# Patient Record
Sex: Male | Born: 1945 | Race: White | Hispanic: No | Marital: Married | Smoking: Former smoker
Health system: Southern US, Community
[De-identification: ages and names within clinical notes are randomized; demographics above are authoritative.]

## PROBLEM LIST (undated history)

## (undated) DIAGNOSIS — K644 Residual hemorrhoidal skin tags: Secondary | ICD-10-CM

## (undated) HISTORY — DX: Residual hemorrhoidal skin tags: K64.4

## (undated) HISTORY — PX: NO PAST SURGERIES: SHX2092

---

## 2008-04-11 ENCOUNTER — Emergency Department: Payer: Self-pay | Admitting: Emergency Medicine

## 2008-04-11 ENCOUNTER — Ambulatory Visit: Payer: Self-pay | Admitting: Internal Medicine

## 2008-04-13 ENCOUNTER — Emergency Department: Payer: Self-pay | Admitting: Internal Medicine

## 2008-04-14 ENCOUNTER — Emergency Department: Payer: Self-pay | Admitting: Internal Medicine

## 2008-04-19 ENCOUNTER — Emergency Department: Payer: Self-pay | Admitting: Emergency Medicine

## 2008-04-21 ENCOUNTER — Emergency Department: Payer: Self-pay | Admitting: Emergency Medicine

## 2009-05-05 ENCOUNTER — Ambulatory Visit: Payer: Self-pay | Admitting: Family Medicine

## 2009-06-08 ENCOUNTER — Ambulatory Visit: Payer: Self-pay | Admitting: Surgery

## 2009-08-10 ENCOUNTER — Ambulatory Visit: Payer: Self-pay | Admitting: Surgery

## 2009-11-24 ENCOUNTER — Ambulatory Visit: Payer: Self-pay | Admitting: Gastroenterology

## 2010-12-27 ENCOUNTER — Ambulatory Visit: Payer: Self-pay | Admitting: Surgery

## 2011-01-04 ENCOUNTER — Ambulatory Visit: Payer: Self-pay | Admitting: Surgery

## 2011-01-23 IMAGING — US US CAROTID DUPLEX BILAT
1 series · 17 of 24 positions shown · non-contrast
Comparison: none

REASON FOR EXAM: Stenosis
COMMENTS:

[Series 1: us carotid duplex bilat · 17 of 53 slices shown]
[im 1/53]
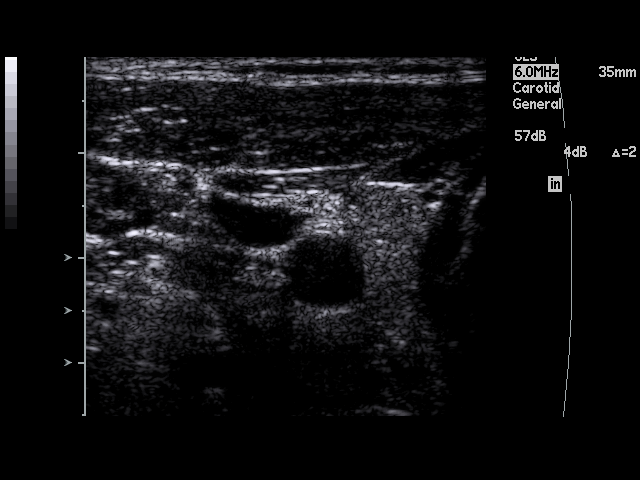
[im 5/53]
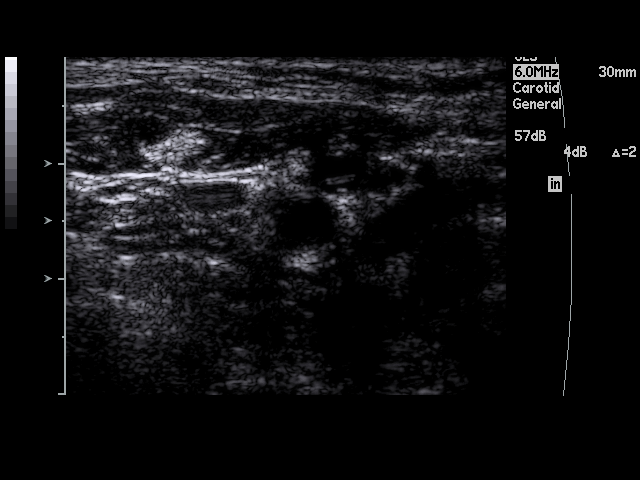
[im 7/53]
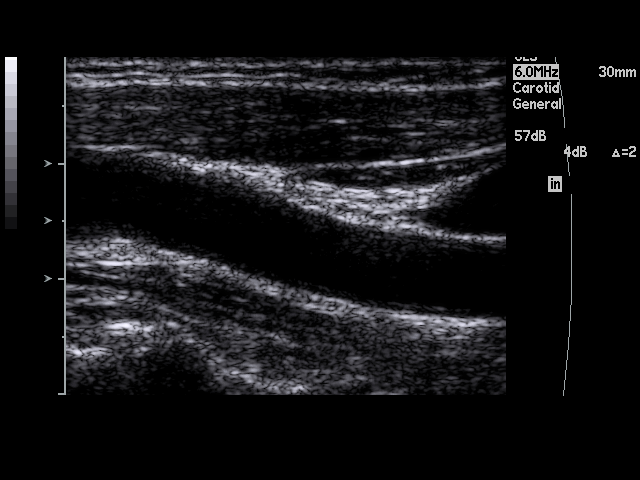
[im 10/53]
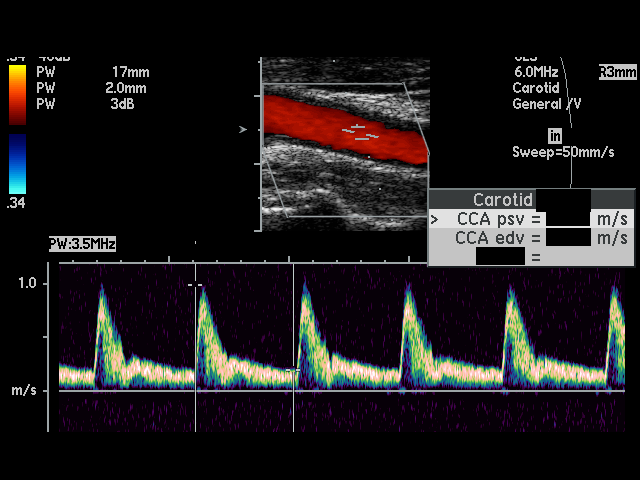
[im 14/53]
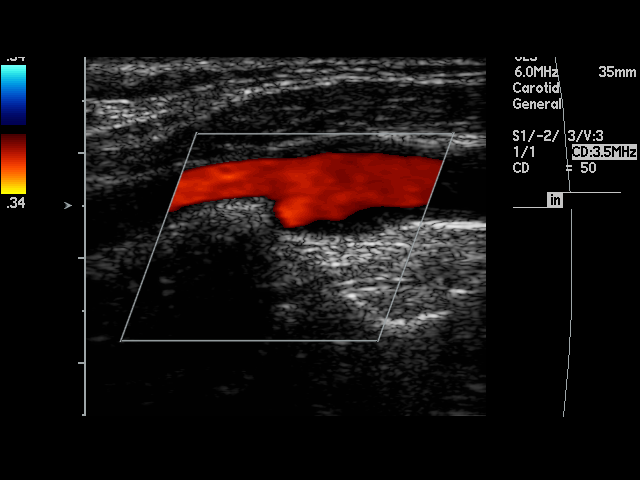
[im 16/53]
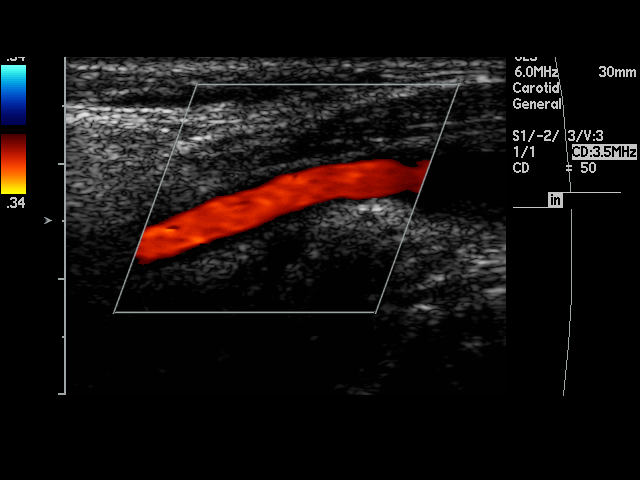
[im 21/53]
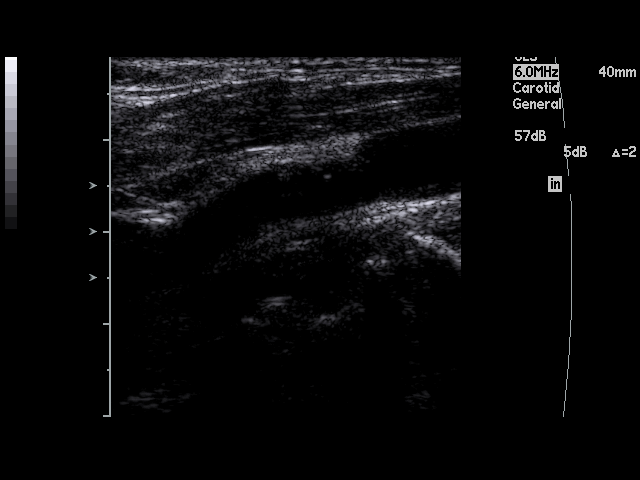
[im 23/53]
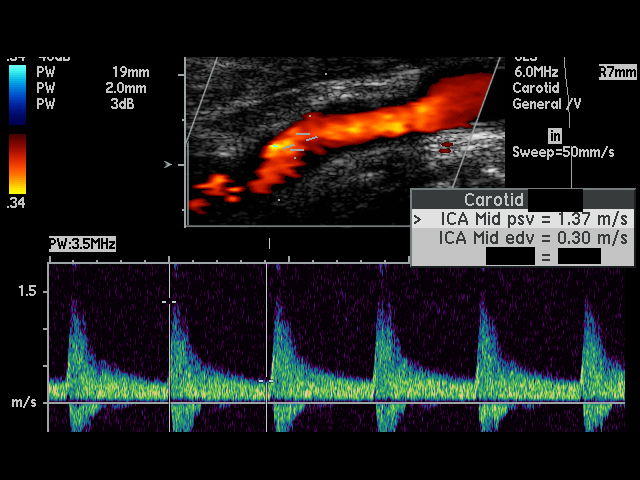
[im 28/53]
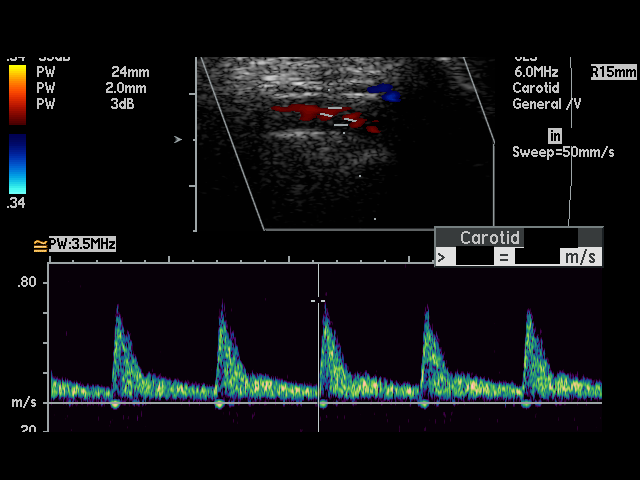
[im 30/53]
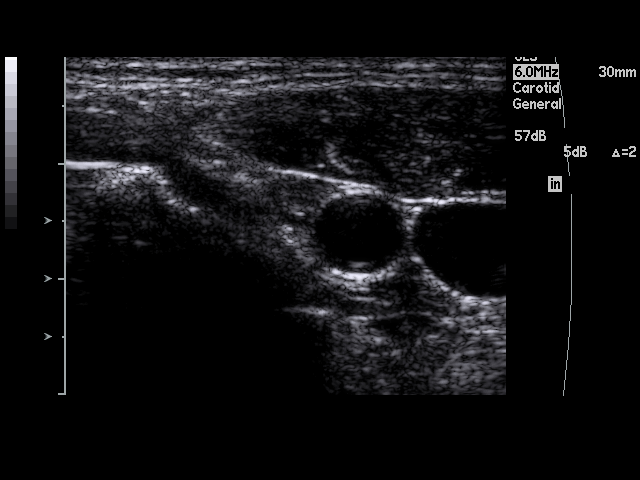
[im 32/53]
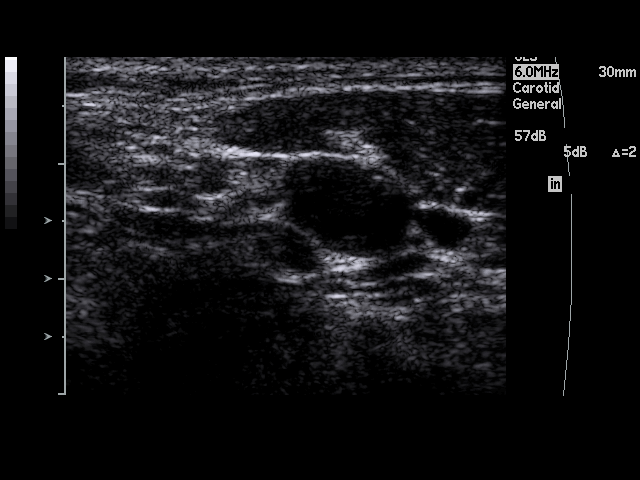
[im 37/53]
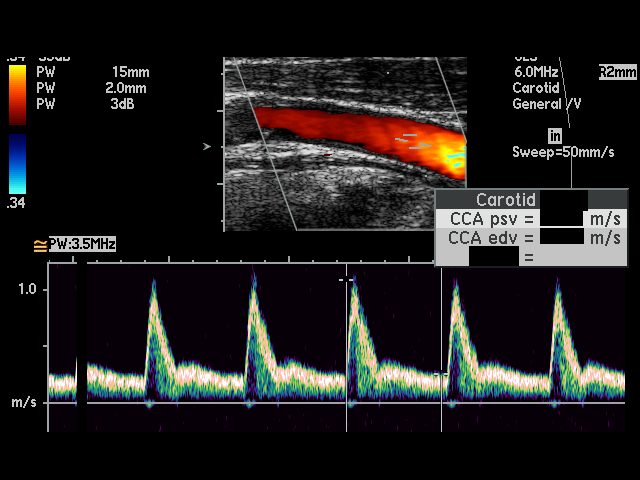
[im 39/53]
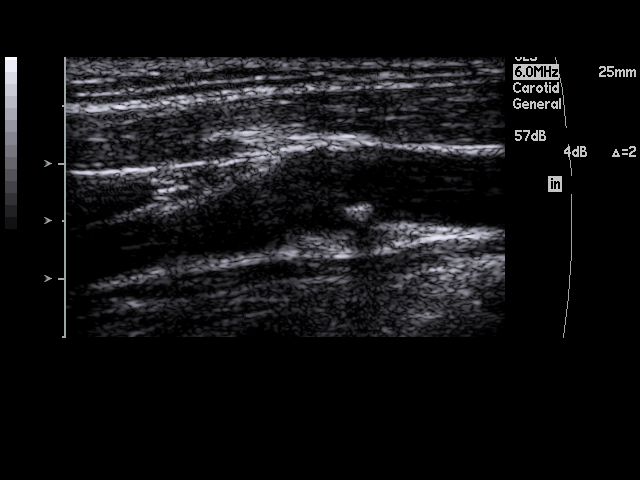
[im 43/53]
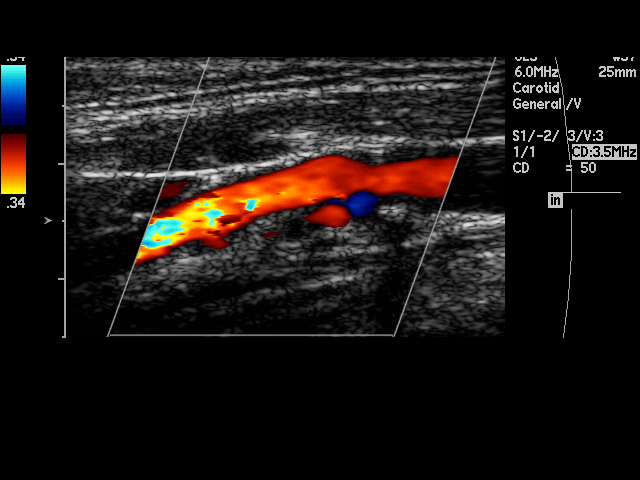
[im 46/53]
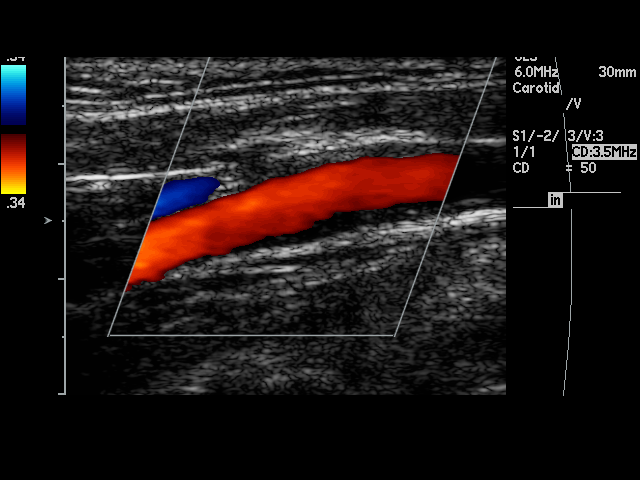
[im 48/53]
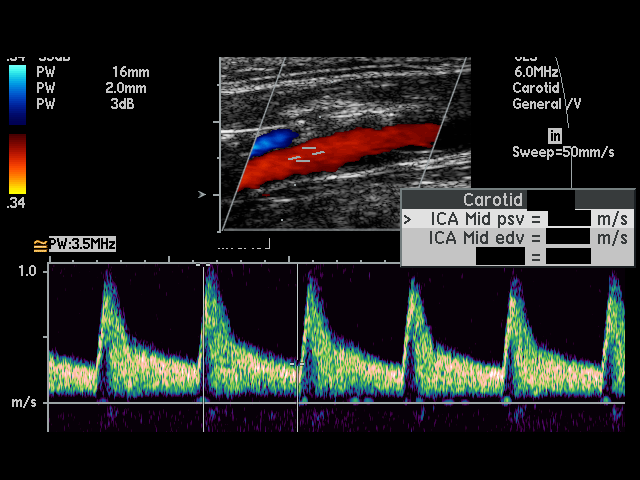
[im 53/53]
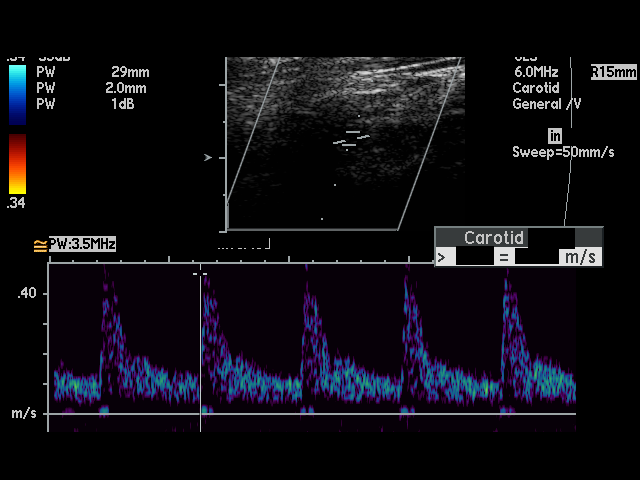

[17 of 24 positions shown; findings below may reference images not displayed]

PROCEDURE:     LATIA - LATIA CAROTID DOPPLER BILATERAL  - May 05, 2009  [DATE]

RESULT:     The patient is status post endarterectomy on the right. There is
a small amount of soft plaque or intimal thickening at the proximal right
internal carotid. On the left there is slight soft and calcific plaque
formation at the bifurcation.

On the right, the peak right common carotid artery flow velocity measures
1.053 meters per second and the peak right internal carotid artery flow
velocity measures 1.47 meters per second. The ICA:CCA ratio is 1.396.

On the left, the peak left common carotid artery flow velocity measures
1.096 meters per second and the peak left internal carotid artery flow
velocity measures 1.054 meters per second. The ICA:CCA ratio is 1.017.

These values bilaterally are consistent with the bilateral absence of
hemodynamically significant stenosis.

There is observed antegrade flow in both vertebrals.
IMPRESSION: 1.  There is a small amount of soft plaque or intimal thickening at the
proximal right internal carotid.
2.  There is slight soft and calcific plaque formation at the carotid
bifurcation on the left.
3.  No hemodynamically significant stenosis is identified on either side.
4.  There is antegrade flow in both vertebrals.

## 2011-04-30 IMAGING — US US PELVIS LIMITED
1 series · 17 of 25 positions shown · non-contrast
Comparison: none

REASON FOR EXAM: right inguinal hernia repair [DATE]   swelling right
testicle
COMMENTS:

[Series 1: us pelvis limited · 17 of 43 slices shown]
[im 1/43]
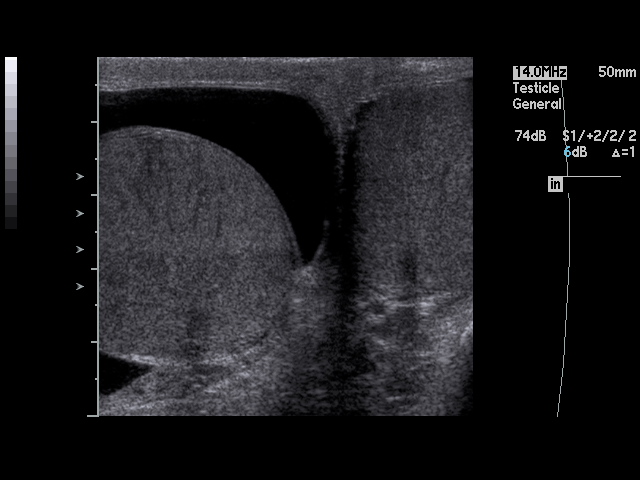
[im 4/43]
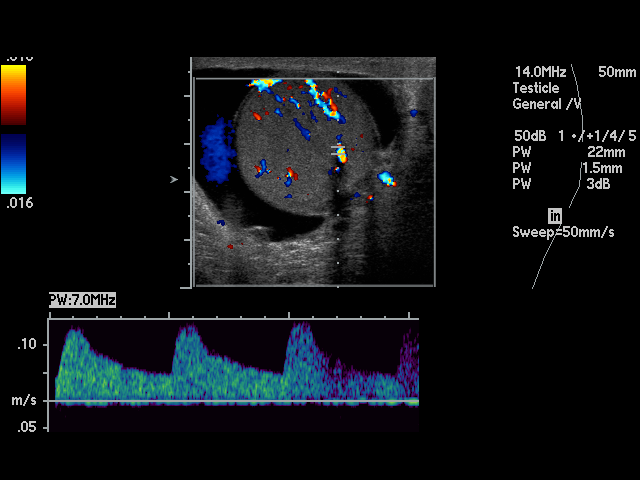
[im 6/43]
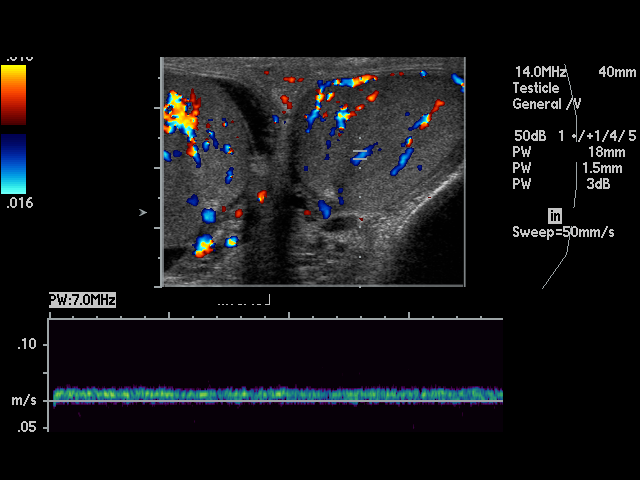
[im 9/43]
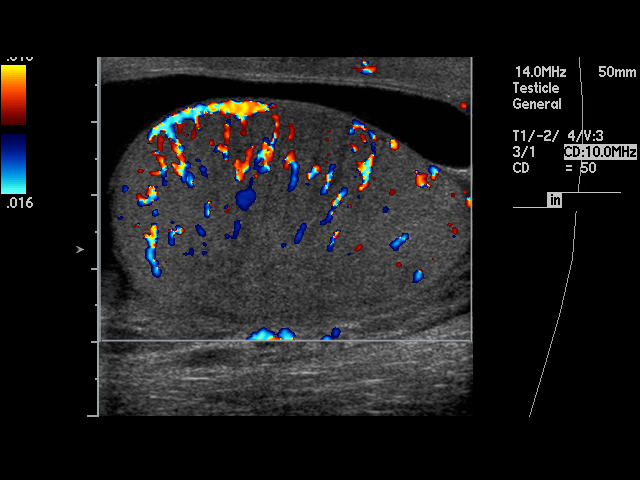
[im 11/43]
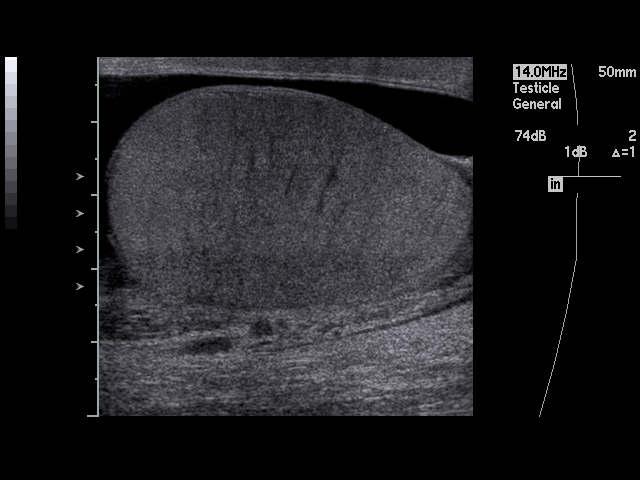
[im 15/43]
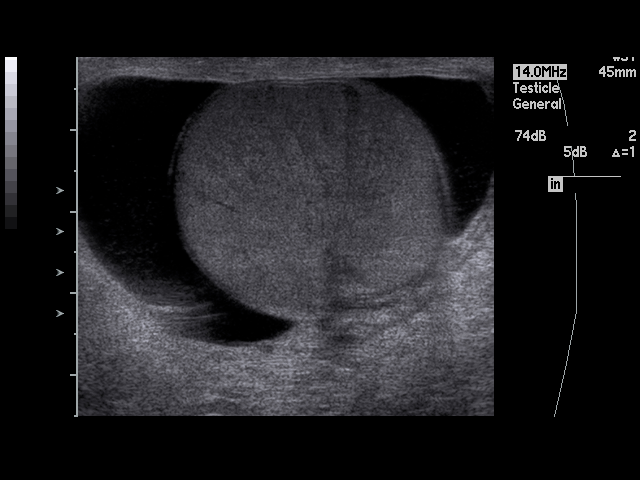
[im 16/43]
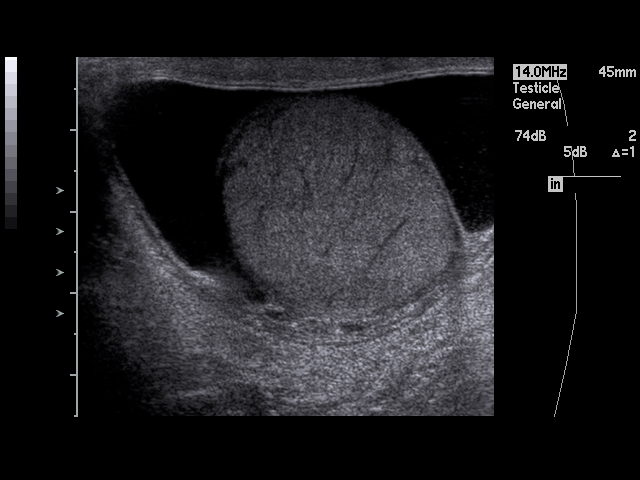
[im 20/43]
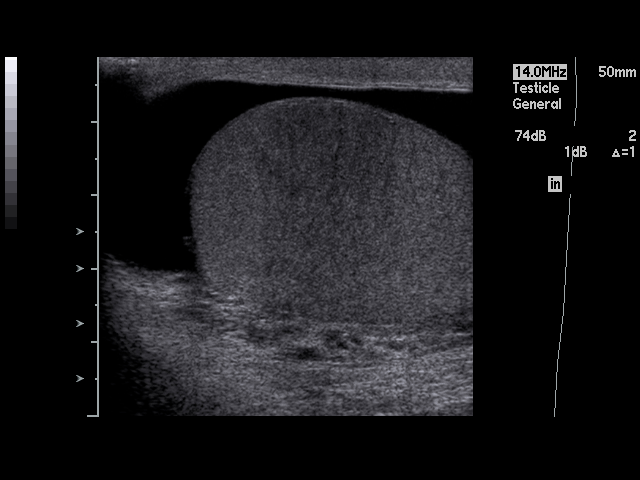
[im 22/43]
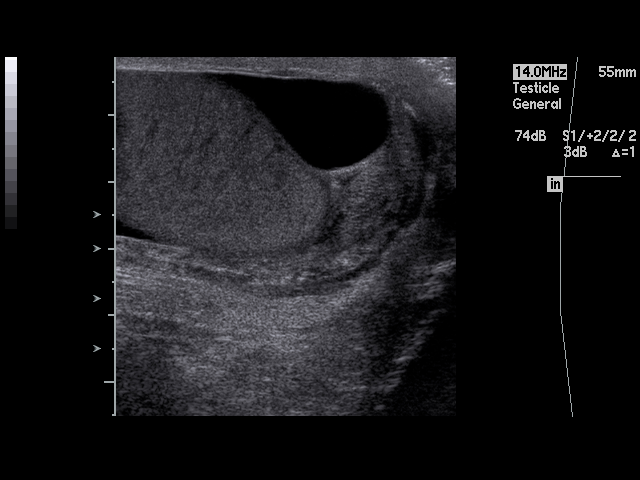
[im 23/43]
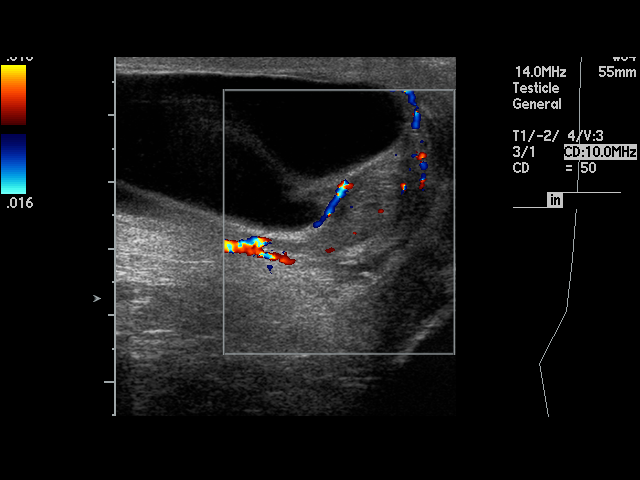
[im 27/43]
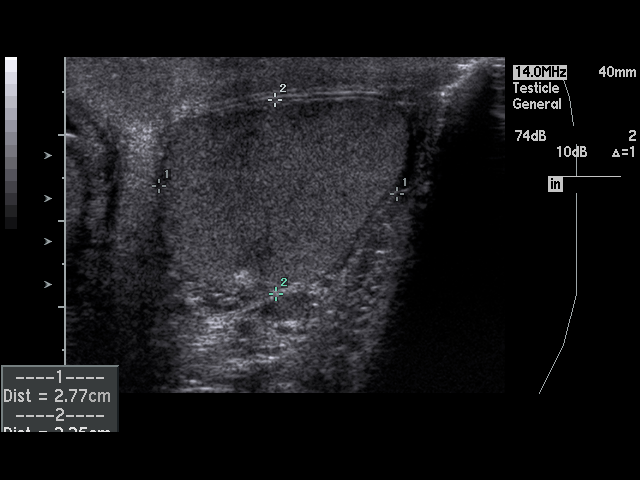
[im 29/43]
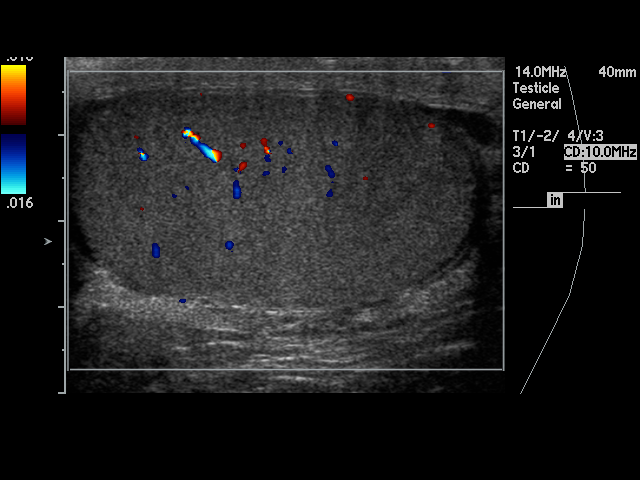
[im 32/43]
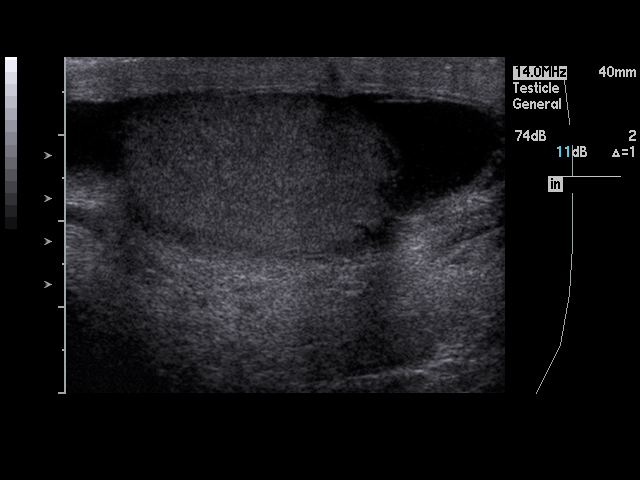
[im 34/43]
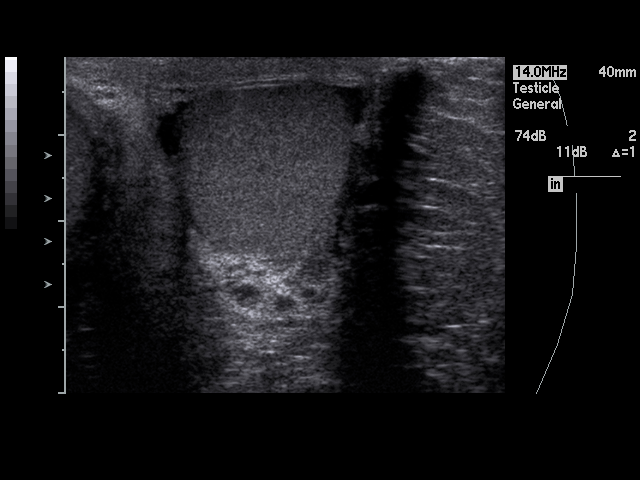
[im 37/43]
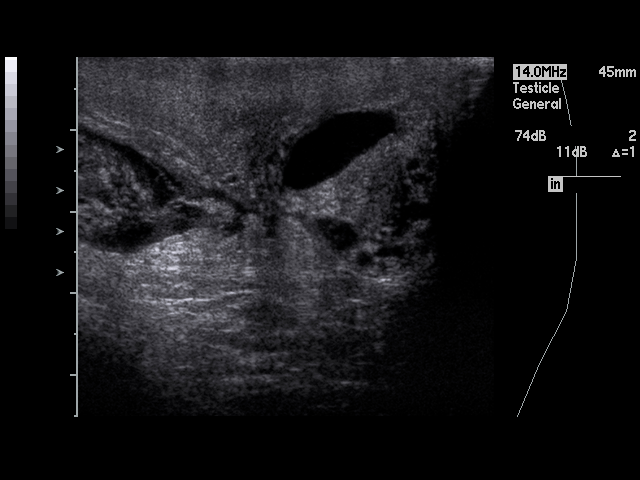
[im 39/43]
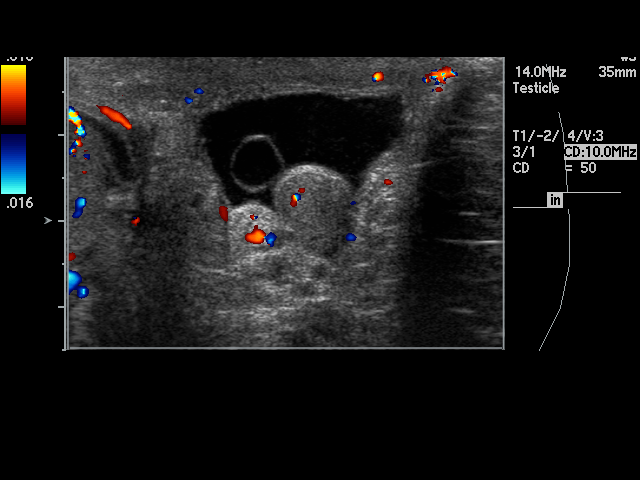
[im 43/43]
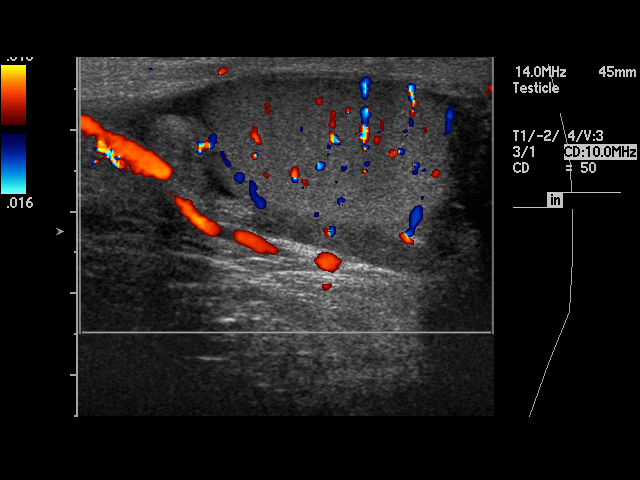

[17 of 25 positions shown; findings below may reference images not displayed]

PROCEDURE:     DZ - DZ TESTICULAR  - August 10, 2009  [DATE]

RESULT:     Testicular ultrasound demonstrates the presence of arterial and
venous flow to both testicles. There is a large right hydrocele. The right
testicle measures 4.93 x 3.08 x 3.10 cm while the left measures 4.72 x
x 2.25 cm.  A moderate left hydrocele is present. There is a prominence of
the epididymal structures bilaterally. On the left, there is a 5.6 mm cystic
structure superiorly which is not definitely within the epididymis or
testicle. No solid testicular masses are seen. The epididymis on the left
measures 10.3 x 0.8 x 4.27 cm while the right epididymis measures 2.51 x
0.65 x 1.15 cm.
IMPRESSION: 1. Bilateral hydroceles larger on the right.
2. No evidence of testicular torsion.
3. Epididymal enlargement bilaterally.
4. Cysts superiorly on the left which is of uncertain etiology. An exophytic
epididymal cyst is not excluded but is felt to be unlikely.

## 2014-07-07 DIAGNOSIS — K219 Gastro-esophageal reflux disease without esophagitis: Secondary | ICD-10-CM | POA: Insufficient documentation

## 2014-08-03 DIAGNOSIS — E7800 Pure hypercholesterolemia, unspecified: Secondary | ICD-10-CM | POA: Insufficient documentation

## 2014-10-01 ENCOUNTER — Ambulatory Visit: Payer: Self-pay | Admitting: Gastroenterology

## 2015-01-18 LAB — SURGICAL PATHOLOGY

## 2015-03-05 DIAGNOSIS — R0681 Apnea, not elsewhere classified: Secondary | ICD-10-CM | POA: Insufficient documentation

## 2015-10-13 DIAGNOSIS — R9439 Abnormal result of other cardiovascular function study: Secondary | ICD-10-CM | POA: Insufficient documentation

## 2015-10-14 DIAGNOSIS — I480 Paroxysmal atrial fibrillation: Secondary | ICD-10-CM | POA: Insufficient documentation

## 2016-02-22 ENCOUNTER — Other Ambulatory Visit: Payer: Self-pay

## 2016-02-22 DIAGNOSIS — I4891 Unspecified atrial fibrillation: Secondary | ICD-10-CM | POA: Insufficient documentation

## 2016-02-22 DIAGNOSIS — I10 Essential (primary) hypertension: Secondary | ICD-10-CM | POA: Insufficient documentation

## 2016-02-23 ENCOUNTER — Encounter: Payer: Self-pay | Admitting: General Surgery

## 2016-02-23 ENCOUNTER — Ambulatory Visit (INDEPENDENT_AMBULATORY_CARE_PROVIDER_SITE_OTHER): Payer: Medicare HMO | Admitting: General Surgery

## 2016-02-23 VITALS — BP 123/73 | HR 61 | Temp 98.1°F | Ht 68.0 in | Wt 179.2 lb

## 2016-02-23 DIAGNOSIS — K648 Other hemorrhoids: Secondary | ICD-10-CM

## 2016-02-23 DIAGNOSIS — K644 Residual hemorrhoidal skin tags: Secondary | ICD-10-CM | POA: Insufficient documentation

## 2016-02-23 MED ORDER — HYDROCORTISONE ACETATE 25 MG RE SUPP: 25.0000 mg | Freq: Two times a day (BID) | RECTAL | Status: DC

## 2016-02-23 NOTE — Progress Notes (Signed)
Patient ID: Tim Fuentes Thome, male   DOB: 08/12/1946, 70 y.o.   MRN: [id number]  HPI Tim Fuentes Michon is a 70 y.o. male who presents to surgical excision for evaluation of a hemorrhoid. Patient states she's never had anything like this before. Last week he was doing a lot of heavy lifting working in an attic and noticed a sudden feeling of pressure in his perirectal area. He was initially painful but now is only tender to palpation. It has never bled and he has been able to be regular throughout. He thinks that he was straining and noticed it first. He denies any fevers, chills, nausea, vomiting, diarrhea, cough patient, chest pain, shortness of breath. The area of concern does not cause any issues with defecation.  HPI  History reviewed. Past medical history: Atrial fibrillation , unspecified (CMS-HCC); Hypertension; GERD (gastroesophageal reflux disease); Hypercholesterolemia; DOE (dyspnea on exertion); Abnormal cardiovascular stress test; and Paroxysmal atrial fibrillation (CMS-HCC)    History reviewed. Past surgical history: Cleft palate repair; Inguinal hernia repair; egd (11/24/2009); and Colonoscopy (10/01/2014).   History reviewed. family history: Coronary artery disease in his father; Heart attack in his father   Social History Social History  Substance Use Topics  . Smoking status: Current Some Day Smoker  . Smokeless tobacco: None  . Alcohol Use: None    Allergies  Allergen Reactions  . Lidocaine Hcl Swelling    States in Xylocaine family but doesn't know what medication     Current Outpatient Prescriptions  Medication Sig Dispense Refill  . aspirin EC 81 MG tablet Take by mouth.    Marland Kitchen. HYDROcodone-acetaminophen (NORCO/VICODIN) 5-325 MG tablet Take by mouth.    Marland Kitchen. lisinopril (PRINIVIL,ZESTRIL) 10 MG tablet Take by mouth.    . metoprolol tartrate (LOPRESSOR) 25 MG tablet     . pantoprazole (PROTONIX) 40 MG tablet Take by mouth.    . ranitidine (ZANTAC) 300 MG tablet     .  simvastatin (ZOCOR) 20 MG tablet Take by mouth.    . hydrocortisone (ANUSOL-HC) 25 MG suppository Place 1 suppository (25 mg total) rectally 2 (two) times daily. 30 suppository 0   No current facility-administered medications for this visit.     Review of Systems A Multi-point review of systems was asked and was negative except for the findings documented in the history of present illness  Physical Exam Blood pressure 123/73, pulse 61, temperature 98.1 F (36.7 C), temperature source Oral, height 5\' 8"  (1.727 Fuentes), weight 81.285 kg (179 lb 3.2 oz). CONSTITUTIONAL: No acute distress. EYES: Pupils are equal, round, and reactive to light, Sclera are non-icteric. EARS, NOSE, MOUTH AND THROAT: The oropharynx is clear. The oral mucosa is pink and moist. Hearing is intact to voice. LYMPH NODES:  Lymph nodes in the neck are normal. RESPIRATORY:  Lungs are clear.  CARDIOVASCULAR: Regular. GI: The abdomen is  soft, nontender, and nondistended.  GU: Rectal deferred.   MUSCULOSKELETAL: Normal muscle strength and tone. No cyanosis or edema.   SKIN: Turgor is good and there are no pathologic skin lesions or ulcers. NEUROLOGIC: Motor and sensation is grossly normal. Cranial nerves are grossly intact. PSYCH:  Oriented to person, place and time. Affect is normal.  Data Reviewed No images and labs to review I have personally reviewed the patient's imaging, laboratory findings and medical records.    Assessment    70 year old male with an external hemorrhoid.    Plan    He has never had any treatment for this nor is there  had this before. Discussed that first-line treatment involves topical steroids and maintain regularity. Encouraged hydration and fiber usage. We will prescribe hydrocortisone suppositoriespatient follow-up in a few weeks for additional check. Patient voiced understanding      Time spent with the patient was 30 minutes, with more than 50% of the time spent in face-to-face  education, counseling and care coordination.     Ricarda Frame, MD FACS General Surgeon 02/23/2016, 3:05 PM

## 2016-02-23 NOTE — Patient Instructions (Signed)
Your insurance will not cover this medication. If this prescription is to expensive you will need to get Hydrocortisone suppositories over the counter. Please see your follow up appointment listed below.

## 2016-02-24 ENCOUNTER — Ambulatory Visit: Payer: Self-pay | Admitting: General Surgery

## 2016-02-24 ENCOUNTER — Telehealth: Payer: Self-pay | Admitting: General Surgery

## 2016-02-24 NOTE — Telephone Encounter (Signed)
[facility] pharmacy called yesterday evening and left a message. Patient was prescribed Anusol-HC but it is not covered by insurance and will cost the patient $228.00. The pharmacist would like to know if they can switch that to proctosol-HC 2.5% cream, which will cost the patient $50-$60. You may call them at [phone] or fax a new prescription to [phone].

## 2016-02-24 NOTE — Telephone Encounter (Signed)
Spoke with pharmacist at this time and gave him a verbal order to switch this medication to be more cost effective for the patient.

## 2016-02-25 ENCOUNTER — Telehealth: Payer: Self-pay | Admitting: General Surgery

## 2016-02-25 NOTE — Telephone Encounter (Signed)
I spoke with patient's wife and she had not picked up the prescription as of yet, however we discussed the importance of applying the medication to help with shrinking the hemorrhoids. She stated it has been bleeding since yesterday and wanted to know if he needed to be seen before picking up the prescription. She was instructed to apply the medication as directed, and if the hemorrhoid was bleeding to where they were changing pad every hour then he would need to be seen in the ED. She was told that it may take up to 3 days before seeing any improvement. Patients wife verbalized understanding.

## 2016-02-25 NOTE — Telephone Encounter (Signed)
Please call patients wife. Patient was seen in office on Wednesday for external hemorrhoids. He has started bleeding Thursday and it has not stopped. His wife states they have not picked up the prescription medicine yet and have been using something over the counter that was suggested.

## 2016-03-07 ENCOUNTER — Ambulatory Visit (INDEPENDENT_AMBULATORY_CARE_PROVIDER_SITE_OTHER): Payer: Medicare HMO | Admitting: General Surgery

## 2016-03-07 ENCOUNTER — Encounter: Payer: Self-pay | Admitting: General Surgery

## 2016-03-07 VITALS — BP 149/76 | HR 59 | Temp 98.5°F | Ht 68.0 in | Wt 180.0 lb

## 2016-03-07 DIAGNOSIS — K648 Other hemorrhoids: Secondary | ICD-10-CM

## 2016-03-07 DIAGNOSIS — K644 Residual hemorrhoidal skin tags: Secondary | ICD-10-CM

## 2016-03-07 NOTE — Patient Instructions (Signed)
Please call our office if you have any questions or concerns.  

## 2016-03-07 NOTE — Progress Notes (Signed)
Outpatient Surgical Follow Up  03/07/2016  Michel SanteeRichard M Fuentes is an 70 y.o. male.   Chief Complaint  Patient presents with  . Follow-up    External Hemorrhoids    HPI: 70 year old male returns to clinic for additional follow-up for his hemorrhoidal disease. He was started on steroid suppositories at his last visit and patient states that the area has dramatically improved since starting that therapy. He states he can barely feel the area when he tries to examine himself now. He denies any fevers, chills, nausea, vomiting, chest pain, shortness of breath, diarrhea, constipation.  Past Medical History  Diagnosis Date  . External hemorrhoids     Past Surgical History  Procedure Laterality Date  . No past surgeries  verified with patient 03/07/16    Family History  Problem Relation Age of Onset  . Heart disease Father     Social History:  reports that he quit smoking about 20 years ago. His smoking use included Cigarettes. He has never used smokeless tobacco. He reports that he drinks alcohol. He reports that he does not use illicit drugs.  Allergies:  Allergies  Allergen Reactions  . Lidocaine Hcl Swelling    States in Xylocaine family but doesn't know what medication     Medications reviewed.    ROS  A multipoint review of systems was completed, all pertinent positives and negatives are documented within the history of present illness and remainder are negative.  BP 149/76 mmHg  Pulse 59  Temp(Src) 98.5 F (36.9 C) (Oral)  Ht 5\' 8"  (1.727 m)  Wt 81.647 kg (180 lb)  BMI 27.38 kg/m2  Physical Exam  Gen.: No acute distress Chest: Clear to auscultation Heart: Regular rate and rhythm Abdomen: Soft and nontender Rectum: Rectal exam performed which shows a grade 1 or 2 internal hemorrhoid, no evidence of external hemorrhoid on exam today. Normal tone without bleeding.   No results found for this or any previous visit (from the past 48 hour(s)). No results  found.  Assessment/Plan:  1. External hemorrhoid 70 year old male who appears to have had a good response to steroid therapy for hemorrhoidal disease. Discussed at length the causes of hemorrhoidal disease being straining and constipation. Stress to the importance of maintaining regularity and refraining from straining with bowel movements. Patient voiced understanding and will follow-up in clinic on an as-needed basis should his hemorrhoids recur.     Ricarda Frameharles Lachandra Dettmann, MD FACS General Surgeon  03/07/2016,2:34 PM

## 2016-12-19 DIAGNOSIS — I6523 Occlusion and stenosis of bilateral carotid arteries: Secondary | ICD-10-CM | POA: Insufficient documentation

## 2017-12-12 DIAGNOSIS — R7302 Impaired glucose tolerance (oral): Secondary | ICD-10-CM | POA: Insufficient documentation

## 2018-07-24 DIAGNOSIS — E538 Deficiency of other specified B group vitamins: Secondary | ICD-10-CM | POA: Insufficient documentation

## 2019-06-09 DIAGNOSIS — N529 Male erectile dysfunction, unspecified: Secondary | ICD-10-CM | POA: Insufficient documentation

## 2019-11-18 ENCOUNTER — Telehealth: Payer: Self-pay | Admitting: Radiology

## 2019-11-18 NOTE — Telephone Encounter (Signed)
Order received via fax for u/s aorta screening on Jan. 13 by Dr. Nilda Simmer.  Per u/s, the diagnosis needs to include smoking hx if any.  Office was called 2x, no new order received as of 2-23. Original order has been purged.

## 2020-04-12 DIAGNOSIS — N1831 Chronic kidney disease, stage 3a: Secondary | ICD-10-CM | POA: Insufficient documentation

## 2021-12-24 DIAGNOSIS — G3184 Mild cognitive impairment, so stated: Secondary | ICD-10-CM | POA: Insufficient documentation

## 2022-04-10 ENCOUNTER — Ambulatory Visit: Payer: Medicare HMO | Admitting: Podiatry

## 2022-04-10 ENCOUNTER — Encounter: Payer: Self-pay | Admitting: Podiatry

## 2022-04-10 DIAGNOSIS — L600 Ingrowing nail: Secondary | ICD-10-CM

## 2022-04-10 DIAGNOSIS — L603 Nail dystrophy: Secondary | ICD-10-CM

## 2022-04-10 DIAGNOSIS — I25118 Atherosclerotic heart disease of native coronary artery with other forms of angina pectoris: Secondary | ICD-10-CM | POA: Insufficient documentation

## 2022-04-10 MED ORDER — NEOMYCIN-POLYMYXIN-HC 1 % OT SOLN: OTIC | 1 refills | Status: AC

## 2022-04-10 NOTE — Patient Instructions (Signed)

## 2022-04-10 NOTE — Progress Notes (Signed)
Subjective:  Patient ID: Tim Fuentes, male    DOB: 1946/03/04,  MRN: [id number] HPI Chief Complaint  Patient presents with   Nail Problem    Hallux bilateral - toenails thick and discolored, right sometimes get sore, has to trim the sides   New Patient (Initial Visit)    76 y.o. male presents with the above complaint.   ROS: Denies fever chills nausea vomiting muscle aches pains calf pain back pain chest pain shortness of breath.  Past Medical History:  Diagnosis Date   External hemorrhoids    Past Surgical History:  Procedure Laterality Date   NO PAST SURGERIES  verified with patient 03/07/16    Current Outpatient Medications:    fluticasone (FLONASE) 50 MCG/ACT nasal spray, Place into both nostrils daily., Disp: , Rfl:    NEOMYCIN-POLYMYXIN-HYDROCORTISONE (CORTISPORIN) 1 % SOLN OTIC solution, Apply 1-2 drops to toe BID after soaking, Disp: 10 mL, Rfl: 1   aspirin EC 81 MG tablet, Take by mouth., Disp: , Rfl:    atorvastatin (LIPITOR) 80 MG tablet, Take 80 mg by mouth daily., Disp: , Rfl:    ezetimibe (ZETIA) 10 MG tablet, Take 10 mg by mouth daily., Disp: , Rfl:    hydrochlorothiazide (HYDRODIURIL) 12.5 MG tablet, Take 12.5 mg by mouth daily., Disp: , Rfl:    lisinopril (ZESTRIL) 40 MG tablet, Take 40 mg by mouth daily., Disp: , Rfl:    metoprolol tartrate (LOPRESSOR) 25 MG tablet, , Disp: , Rfl:    mirtazapine (REMERON) 15 MG tablet, Take 15 mg by mouth at bedtime., Disp: , Rfl:    pantoprazole (PROTONIX) 40 MG tablet, Take by mouth., Disp: , Rfl:    rivastigmine (EXELON) 9.5 mg/24hr, 9.5 mg daily., Disp: , Rfl:   Allergies  Allergen Reactions   Lidocaine Hcl Swelling    States in Xylocaine family but doesn't know what medication    Review of Systems Objective:  There were no vitals filed for this visit.  General: Well developed, nourished, in no acute distress, alert and oriented x3   Dermatological: Skin is warm, dry and supple bilateral. Nails x 10 are well  maintained; remaining integument appears unremarkable at this time. There are no open sores, no preulcerative lesions, no rash or signs of infection present.  Ingrown toenail mild paronychia mild erythema no cellulitis drainage or odor tibial and fibular border hallux right  Vascular: Dorsalis Pedis artery and Posterior Tibial artery pedal pulses are 2/4 bilateral with immedate capillary fill time. Pedal hair growth present. No varicosities and no lower extremity edema present bilateral.   Neruologic: Grossly intact via light touch bilateral. Vibratory intact via tuning fork bilateral. Protective threshold with Semmes Wienstein monofilament intact to all pedal sites bilateral. Patellar and Achilles deep tendon reflexes 2+ bilateral. No Babinski or clonus noted bilateral.   Musculoskeletal: No gross boney pedal deformities bilateral. No pain, crepitus, or limitation noted with foot and ankle range of motion bilateral. Muscular strength 5/5 in all groups tested bilateral.  Gait: Unassisted, Nonantalgic.    Radiographs:  None taken  Assessment & Plan:   Assessment: Ingrown toenail tibial and fibular border hallux right  Plan: Chemical matricectomy was performed today after local anesthetic was administered.  He was given both oral and written home-going instruction for the care and soaking of the toe as well as a prescription for Cortisporin Otic to be applied twice daily after soaking.  Would like to him in about 2 weeks call with questions or concerns.  Garrel Ridgel, DPM

## 2022-04-24 ENCOUNTER — Ambulatory Visit: Payer: Medicare HMO | Admitting: Podiatry

## 2022-04-24 ENCOUNTER — Encounter: Payer: Self-pay | Admitting: Podiatry

## 2022-04-24 DIAGNOSIS — Z9889 Other specified postprocedural states: Secondary | ICD-10-CM

## 2022-04-24 DIAGNOSIS — L600 Ingrowing nail: Secondary | ICD-10-CM

## 2022-04-24 NOTE — Progress Notes (Signed)
He presents today for follow-up of his matrixectomy right hallux.  He states that is doing fine and really not doing anything to it anymore.  Objective: Vital signs are stable alert and oriented x3.  There is no erythema just 1 small area granulation tissue with scab and may have peeled off of it.  No purulence no malodor no signs of infection.  Assessment: Well-healing surgical toe.  Plan: Encouraged him to continue his soaking every other day Epsom salts and warm water covered in the daytime but leave open at bedtime until the entire part of that area scabs over.  He will notify us with any Questions or concerns.

## 2024-08-25 ENCOUNTER — Ambulatory Visit: Payer: Self-pay | Admitting: Podiatry
